# Patient Record
Sex: Male | Born: 1960 | Race: White | Hispanic: No | Marital: Married | Smoking: Never smoker
Health system: Southern US, Academic
[De-identification: ages and names within clinical notes are randomized; demographics above are authoritative.]

## PROBLEM LIST (undated history)

## (undated) DIAGNOSIS — I4891 Unspecified atrial fibrillation: Secondary | ICD-10-CM

## (undated) DIAGNOSIS — E119 Type 2 diabetes mellitus without complications: Secondary | ICD-10-CM

## (undated) DIAGNOSIS — E7800 Pure hypercholesterolemia, unspecified: Secondary | ICD-10-CM

## (undated) DIAGNOSIS — E559 Vitamin D deficiency, unspecified: Secondary | ICD-10-CM

## (undated) DIAGNOSIS — I10 Essential (primary) hypertension: Secondary | ICD-10-CM

## (undated) HISTORY — PX: HX CARDIAC ABLATION: 2100001323

---

## 2022-05-16 ENCOUNTER — Encounter (HOSPITAL_COMMUNITY): Payer: Self-pay | Admitting: Certified Registered"

## 2022-05-16 ENCOUNTER — Encounter (HOSPITAL_COMMUNITY)
Admission: RE | Disposition: A | Discharge status: Home / Self Care | Payer: Self-pay | Source: Home / Self Care | Attending: Podiatrist

## 2022-05-16 ENCOUNTER — Other Ambulatory Visit: Payer: Self-pay

## 2022-05-16 ENCOUNTER — Inpatient Hospital Stay
Admission: RE | Admit: 2022-05-16 | Discharge: 2022-05-16 | Disposition: A | Discharge status: Home / Self Care | Payer: BC Managed Care – PPO | Attending: Podiatrist | Admitting: Podiatrist

## 2022-05-16 ENCOUNTER — Encounter (HOSPITAL_COMMUNITY): Payer: Self-pay | Admitting: Podiatrist

## 2022-05-16 DIAGNOSIS — M19072 Primary osteoarthritis, left ankle and foot: Secondary | ICD-10-CM | POA: Insufficient documentation

## 2022-05-16 HISTORY — DX: Type 2 diabetes mellitus without complications (CMS HCC): E11.9

## 2022-05-16 HISTORY — DX: Essential (primary) hypertension: I10

## 2022-05-16 HISTORY — DX: Unspecified atrial fibrillation (CMS HCC): I48.91

## 2022-05-16 HISTORY — DX: Pure hypercholesterolemia, unspecified: E78.00

## 2022-05-16 HISTORY — DX: Vitamin D deficiency, unspecified: E55.9

## 2022-05-16 SURGERY — AMPUTATION TOE
Anesthesia: Local (Nurse-Monitored) | Site: Toe | Laterality: Left | Wound class: Dirty or Infected Wounds-Include old traumatic wounds

## 2022-05-16 MED ORDER — BUPIVACAINE-EPINEPHRINE 0.5 %-1:200,000 INJECTION SOLUTION
Freq: Once | INTRAMUSCULAR | Status: DC | PRN
Start: 2022-05-16 — End: 2022-05-16
  Administered 2022-05-16: 3 mL via INTRAMUSCULAR

## 2022-05-16 MED ORDER — LIDOCAINE-EPINEPHRINE (PF) 1 %-1:200,000 INJECTION SOLUTION
Freq: Once | INTRAMUSCULAR | Status: DC | PRN
Start: 2022-05-16 — End: 2022-05-16
  Administered 2022-05-16: 20 mL via INTRAMUSCULAR

## 2022-05-16 MED ORDER — LIDOCAINE HCL 10 MG/ML (1 %) INJECTION SOLUTION
INTRAMUSCULAR | Status: AC
Start: 2022-05-16 — End: 2022-05-16
  Filled 2022-05-16: qty 20

## 2022-05-16 MED ORDER — LIDOCAINE-EPINEPHRINE (PF) 1 %-1:200,000 INJECTION SOLUTION
INTRAMUSCULAR | Status: AC
Start: 2022-05-16 — End: 2022-05-16
  Filled 2022-05-16: qty 10

## 2022-05-16 MED ORDER — ROPIVACAINE (PF) 2 MG/ML (0.2 %) INJECTION SOLUTION
INTRAMUSCULAR | Status: AC
Start: 2022-05-16 — End: 2022-05-16
  Filled 2022-05-16: qty 10

## 2022-05-16 MED ORDER — LACTATED RINGERS INTRAVENOUS SOLUTION
INTRAVENOUS | Status: DC
Start: 2022-05-16 — End: 2022-05-16

## 2022-05-16 MED ORDER — LIDOCAINE HCL 10 MG/ML (1 %) INJECTION SOLUTION
Freq: Once | INTRAMUSCULAR | Status: DC | PRN
Start: 2022-05-16 — End: 2022-05-16
  Administered 2022-05-16: 5 mL via INTRAMUSCULAR

## 2022-05-16 MED ORDER — CEFAZOLIN 1 GRAM SOLUTION FOR INJECTION
INTRAMUSCULAR | Status: AC
Start: 2022-05-16 — End: 2022-05-16
  Filled 2022-05-16: qty 30

## 2022-05-16 MED ORDER — SODIUM CHLORIDE 0.9 % INTRAVENOUS PIGGYBACK
INJECTION | INTRAVENOUS | Status: AC
Start: 2022-05-16 — End: 2022-05-16
  Filled 2022-05-16: qty 150

## 2022-05-16 MED ORDER — BUPIVACAINE (PF) 0.25 % (2.5 MG/ML) INJECTION SOLUTION
INTRAMUSCULAR | Status: AC
Start: 2022-05-16 — End: 2022-05-16
  Filled 2022-05-16: qty 10

## 2022-05-16 SURGICAL SUPPLY — 55 items
BAG SUT DVN STRL LF (SUTURE/WOUND CLOSURE) IMPLANT
BANDAGE 3.6YDX3.4IN 6 PLY HYPOALL LOFT LIGHT STRCH COTTON GAUZE STRL LF  DISP (WOUND CARE SUPPLY) ×2 IMPLANT
BANDAGE COFLX 5YDX4IN NONST CHSV SLF ADH FOAM COMPRESS TAN LF (WOUND CARE SUPPLY) ×1 IMPLANT
BLADE 10 2 END CBNSTL SURG STRL DISP (SURGICAL CUTTING SUPPLIES) ×2 IMPLANT
BLADE 15 2 END CBNSTL SURG STRL DISP (SURGICAL CUTTING SUPPLIES) ×1 IMPLANT
CLEANER ESURG TIP 2X2IN TIP POLISHR CAUT STRL LF (SURGICAL CUTTING SUPPLIES) IMPLANT
CONTAINR CLICKSEAL 4OZ TRANSLUC SCREW CAP STRL BLU SPECI PNEUM TUBE SYS (SPECIMEN COLLECTION SUPPLIES) ×1 IMPLANT
CONV USE 123874 - SYRINGE AMSURE MDCHC 60CC LF  STRL TIP PRTC SM TUBE ADPR IRRG DISC BULB POLYPROP (MED SURG SUPPLIES) IMPLANT
CONV USE ITEM 338662 - PACK SURG ASCP STRL DISP ~~LOC~~ BPT MED CNTR LF (CUSTOM TRAYS & PACK) ×1 IMPLANT
COUNTER 20 CNT BLOCK ADH NEEDLE STRL LF  RD SHARP FOAM 15.75X11.5X14IN DISP (MED SURG SUPPLIES) ×1 IMPLANT
COVER 53X24IN MAYOSTAND PRXM STRL DISP EQP SMS LF (DRAPE/PACKS/SHEETS/OR TOWEL) IMPLANT
COVER TBL 90X50IN STD SMS REINF FNFLD STRL LF  DISP (DRAPE/PACKS/SHEETS/OR TOWEL) IMPLANT
CUFF TOURNIQUET PURP 34X4IN COLOR CUF CYL 2 PORT 1 BLADDER QC 40IN STRL LF  DISP (MED SURG SUPPLIES) IMPLANT
CUFF TOURNIQUET RD 18X4IN COLOR CUF CYL 2 PORT BLADDER QC LOW PROF STRL LF  DISP (MED SURG SUPPLIES) IMPLANT
CUFF TOURNIQUET RYL BLU 30X4IN COLOR CUF CYL 2 PORT 1 BLADDER QC LOW PROF 40IN STRL LF  DISP (MED SURG SUPPLIES) IMPLANT
DETERGENT INSTR 22OZ TRNSPT GEL RINSE FREE NEUT PH PREKLENZ CLR PLSNT LF (MISCELLANEOUS PT CARE ITEMS) ×1 IMPLANT
DISCONTINUED USE ITEM 300806 - SUTURE 3-0 ETHILON 30IN BLK MONOF NONAB (SUTURE/WOUND CLOSURE) ×1 IMPLANT
DRAPE FNFLD ABS REINF 77X53IN 43528 PRXM LF  STRL DISP SURG SMS 44X23IN (DRAPE/PACKS/SHEETS/OR TOWEL) IMPLANT
DRESS PETRO 9X5IN CURAD XR COTTON NONADH OCL IMPREGNATE LF  STRL WHT (WOUND CARE SUPPLY) ×1 IMPLANT
ELECTRODE ESURG BLADE PNCL 15FT VLAB EDGE TELESCP SMOKE EVAC (SURGICAL CUTTING SUPPLIES) ×1 IMPLANT
GLOVE SURG 6 LF  PF BEAD CUF STRL CRM 11.3IN PROTEXIS PLISPRN THK9.1 MIL (GLOVES AND ACCESSORIES) IMPLANT
GLOVE SURG 6 LF  PF SMOOTH BEAD CUF INTLK STRL BLU 11.3IN PROTEXIS NEU-THERA PLISPRN THK7.9 MIL (GLOVES AND ACCESSORIES) IMPLANT
GLOVE SURG 6.5 LF  PF BEAD CUF STRL CRM 11.3IN PROTEXIS PI PLISPRN THK9.1 MIL (GLOVES AND ACCESSORIES) IMPLANT
GLOVE SURG 6.5 LF  PF SMOOTH BEAD CUF INTLK STRL BLU 11.3IN PROTEXIS NEU-THERA PLISPRN THK7.9 MIL (GLOVES AND ACCESSORIES) IMPLANT
GLOVE SURG 6.5 LTX PF SMOOTH BEAD CUF STRL YW 11.5IN PROTEXIS NEU-THERA DDRGL THK8.7 MIL (GLOVES AND ACCESSORIES) IMPLANT
GLOVE SURG 7 LF  PF BEAD CUF STRL CRM 11.8IN PROTEXIS PI PLISPRN THK9.1 MIL (GLOVES AND ACCESSORIES) ×1 IMPLANT
GLOVE SURG 7 LF  PF SMOOTH BEAD CUF INTLK STRL BLU 11.8IN PROTEXIS NEU-THERA PLISPRN THK7.9 MIL (GLOVES AND ACCESSORIES) IMPLANT
GLOVE SURG 7 LTX PF SMOOTH BEAD CUF STRL YW 12IN PROTEXIS NEU-THERA DDRGL THK8.7 MIL (GLOVES AND ACCESSORIES) IMPLANT
GLOVE SURG 7.5 LF  PF BEAD CUF STRL CRM 11.8IN PROTEXIS PI PLISPRN THK9.1 MIL (GLOVES AND ACCESSORIES) IMPLANT
GLOVE SURG 7.5 LF  PF SMOOTH BEAD CUF INTLK STRL BLU 11.8IN PROTEXIS NEU-THERA PLISPRN THK7.9 MIL (GLOVES AND ACCESSORIES) IMPLANT
GLOVE SURG 7.5 LTX PF SMOOTH BEAD CUF STRL YW 12IN PROTEXIS (GLOVES AND ACCESSORIES) IMPLANT
GLOVE SURG 8 LF  PF BEAD CUF STRL CRM 11.8IN PROTEXIS PI PLISPRN THK9.1 MIL (GLOVES AND ACCESSORIES) IMPLANT
GLOVE SURG 8 LF  PF SMOOTH BEAD CUF INTLK STRL BLU 11.8IN PROTEXIS NEU-THERA PLISPRN THK7.9 MIL (GLOVES AND ACCESSORIES) IMPLANT
GLOVE SURG 8 LTX PF SMOOTH BEAD CUF STRL YW 12IN PROTEXIS NEU-THERA DDRGL THK8.7 MIL (GLOVES AND ACCESSORIES) ×1 IMPLANT
GLOVE SURG 8.5 LF  PF BEAD CUF STRL CRM 11.8IN PROTEXIS PI PLISPRN THK9.1 MIL (GLOVES AND ACCESSORIES) IMPLANT
GOWN SURG XL STD LGTH L3 NONREINFORCE HKLP CLSR TWL STRL LF  DISP BLU SPECTRUM SMS (DRAPE/PACKS/SHEETS/OR TOWEL)
GOWN SURG XL STD LGTH L3 NONREINFORCE HKLP CLSR TWL STRL LF DISP BLU SPECTRUM SMS (DRAPE/PACKS/SHEETS/OR TOWEL) IMPLANT
HEMOSTAT ABS 8X4IN FLXB SHR WV_SRGCL STRL DISP (WOUND CARE SUPPLY) IMPLANT
LABEL MED CORRECT MED LABELING SYS 4 FLG 2 SHEET 24 PRPRNT STRL (MED SURG SUPPLIES) ×1 IMPLANT
MAT INSTR TRY 44X36IN WTPRF BACKSHEET TPNX BLU (MISCELLANEOUS PT CARE ITEMS) ×1 IMPLANT
NEEDLE HYPO  18GA 1.5IN REG WL BD PRCSNGL POLYPROP REG BVL LL HUB CLR CD DEHP-FR STRL LF  DISP (MED SURG SUPPLIES) ×1 IMPLANT
NEEDLE HYPO  25GA 1.25IN STD MONOJECT AL SS REG BVL LL HUB UL SHRP ANTICORE RD STRL LF  DISP (MED SURG SUPPLIES) ×3 IMPLANT
PACK SURG ASCP STRL DISP ~~LOC~~ BPT MED CNTR LF (CUSTOM TRAYS & PACK) ×1
PAD ABDOMINAL 8X7.5IN LF  STRL (WOUND CARE SUPPLY) ×2 IMPLANT
SOL IRRG 0.9% NACL 1000ML PLASTIC PR BTL ISTNC N-PYRG STRL LF (MEDICATIONS/SOLUTIONS) IMPLANT
SPONGE GAUZE 4X4IN MDCHC COTTON 12 PLY TY 7 LF  STRL DISP (WOUND CARE SUPPLY) ×2 IMPLANT
SPONGE SURG 4X4IN 16 PLY XRY DETECT COTTON STRL LF  DISP (WOUND CARE SUPPLY) IMPLANT
STKNT ORTHO 48X4IN COTTON PLSTR 1 PLY PCUT SEWN END LF  TUB STRL OFF WHT (ORTHOPEDICS (NOT IMPLANTS)) ×1 IMPLANT
STRIP 5YDX.5IN IFRM COTTON GAUZE WOUND CURAD WOVEN STRL LF (WOUND CARE SUPPLY) IMPLANT
SUTURE 3-0 ETHILON 30IN BLK MONOF NONAB (SUTURE/WOUND CLOSURE) ×1
SUTURE 4-0 FS2 ETHILON 18IN BLK MONOF NONAB (SUTURE/WOUND CLOSURE) ×1 IMPLANT
SYRINGE AMSURE MDCHC 60CC LF  STRL TIP PRTC SM TUBE ADPR IRRG DISC BULB POLYPROP (MED SURG SUPPLIES)
SYRINGE LL 10ML LF  STRL GRAD N-PYRG DEHP-FR PVC FREE MED DISP (MED SURG SUPPLIES) ×3 IMPLANT
TOWEL 24X16IN COTTON BLU DISP SURG STRL LF (DRAPE/PACKS/SHEETS/OR TOWEL) ×2 IMPLANT
TUBING SUCT CLR 6FT .25IN ARGYLE PVC NCDTV STR MALE FEMALE MLD CONN STRL LF (MED SURG SUPPLIES) ×1 IMPLANT

## 2022-05-16 NOTE — OR Surgeon (Signed)
PATIENT NAME: Leonard Smith, Leonard Smith   HOSPITAL NUMBER:  [id number]  DATE OF SERVICE: 05/16/2022  DATE OF BIRTH:  1960/07/12    OPERATIVE REPORT    PREOPERATIVE DIAGNOSIS:  Osteoarthritis, left hallux.    PREOPERATIVE DIAGNOSIS:  Osteoarthritis, left hallux.    NAME OF PROCEDURE:  Amputation at the metatarsophalangeal joints (MPJ), left hallux.    SURGEON:  Clydene Laming, DPM.    ASSISTANT:  None.    ANESTHESIA:  Local.    ESTIMATED BLOOD LOSS:  Less than 3 mL.    DESCRIPTION OF PROCEDURE:  The patient was brought into the operating room and placed on the operating table in a supine position.  The left foot was blocked using a male block technique utilizing a 7:3 mixture of 0.25% plain Marcaine and 1% lidocaine with epinephrine.  Next the left foot was scrubbed, prepped, and draped in the usual aseptic manner.  Attention was then directed to the dorsal aspect of the left hallux where a [place] incision was made.  The incision was deepened to the subcutaneous tissue with sharp and blunt dissection.  Care was taken to identify and retract all the vital and neurovascular structures.  All bleeders were cauterized and ligated as necessary. The digit was disarticulated at the MPJ.  It was removed and sent to pathology for further review.  There were no signs of infection of the metatarsal head or abscess.  The area was flushed with copious amounts of sterile normal saline.  Deep wound cultures were taken.  Next the incision was reapproximated and coapted using 3-0 and 4-0 nylon.  A postoperative dressing consisting of 4 x 4's, Kerlix, ABDs, Coban, and Xeroform was applied.  The patient tolerated the procedure and anesthesia well.  He was transferred with vital signs stable and vascular status intact.        Clydene Laming, DPM                DD:  05/16/2022 21:00:27  DT:  05/16/2022 23:07:53 MH  D#:  [id number]

## 2022-05-16 NOTE — Discharge Instructions (Signed)
Follow up with Dr Everardo All.     Do not remove dressing.  You can add additional dressings to the existing dressing if needed for drainage.    Weight bearing on operative foot is NON-WEIGHT BEAR.     Call for problems, questions, concerns.    Resume home meds.    Elevate foot on 2-3 pillows.    Wear cast shoe with ambulation.

## 2022-05-17 NOTE — Nurses Notes (Signed)
Addendium to chart. Bilateral Pedal pulses palpated. Feet warm to touch. Great toe left foot red. Dressing noted.

## 2022-05-21 LAB — ANAEROBIC CULTURE: ANAEROBIC CULTURE: NO GROWTH

## 2022-05-23 LAB — TISSUE CULTURE (AEROBIC CULT & GRAM STAIN): GRAM STAIN: NONE SEEN

## 2022-05-25 DIAGNOSIS — M869 Osteomyelitis, unspecified: Secondary | ICD-10-CM

## 2022-05-25 LAB — SURGICAL PATHOLOGY SPECIMEN

## 2022-07-26 IMAGING — MR MRI LT FOOT W AND WO CONTRAST
4 of 9 series · 19 of 40 positions shown · IV contrast (gadavist)
Comparison: None available.

﻿EXAM:  [id number]   MRI LT FOOT W AND WO CONTRAST
INDICATION: Recent surgery done to remove big toe due to osteomyelitis, check for infection around toe area.
TECHNIQUE: Multiplanar, multisequence MRI was performed both before and after the intravenous administration of 10 mL Gadavist.

[Series 9: T1 · sagittal · left · 4.0mm · 0.59mm/px · 5 of 22 slices shown (1 of 2)]
[im 1/22]
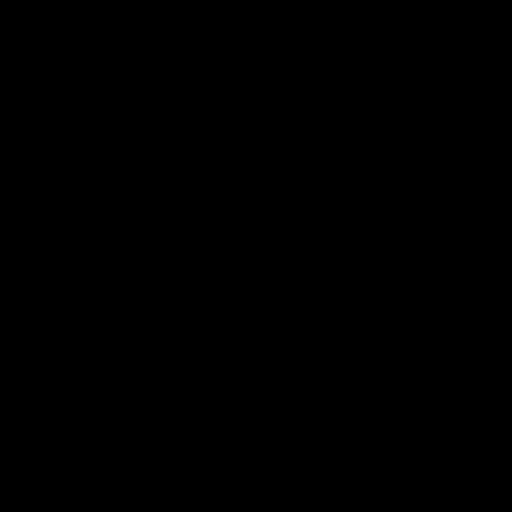
[im 6/22]
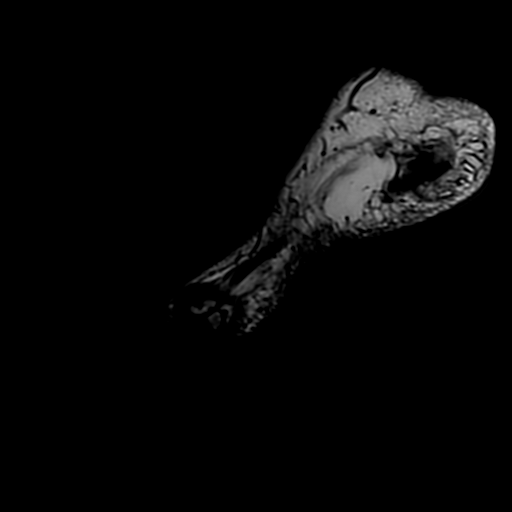
[im 11/22]
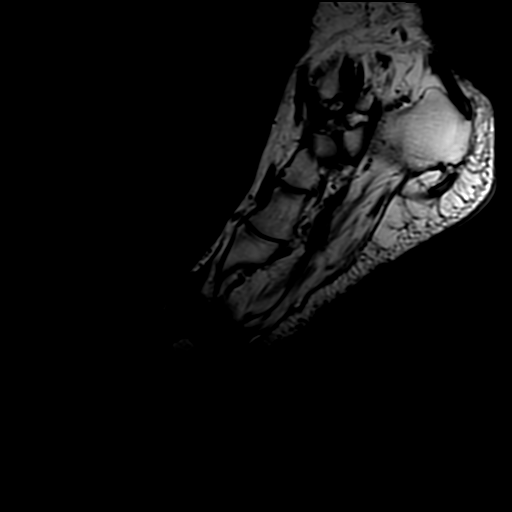
[im 16/22]
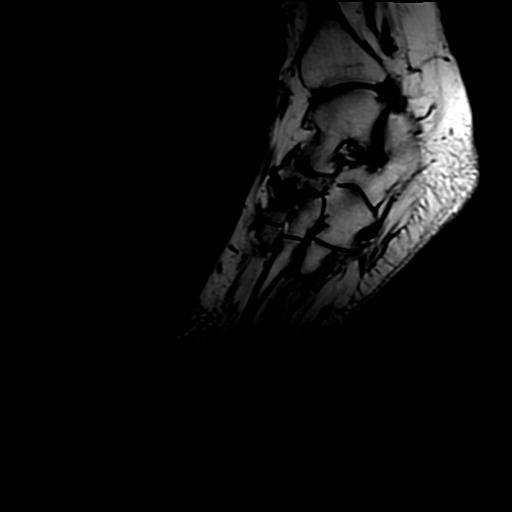
[im 22/22]
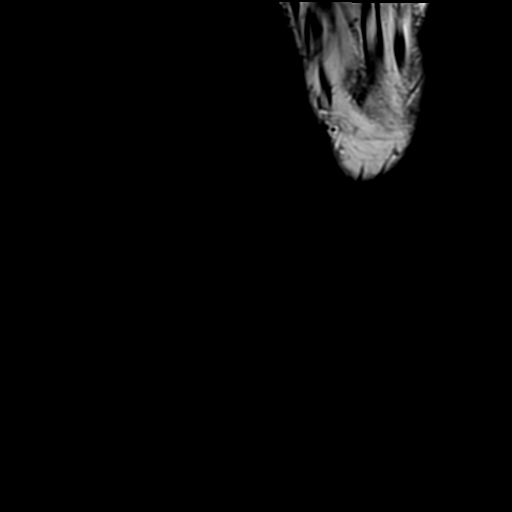

[Series 10: T2 fat-sat · sagittal · left · 4.0mm · 0.59mm/px · 5 of 22 slices shown (1 of 2)]
[im 1/22]
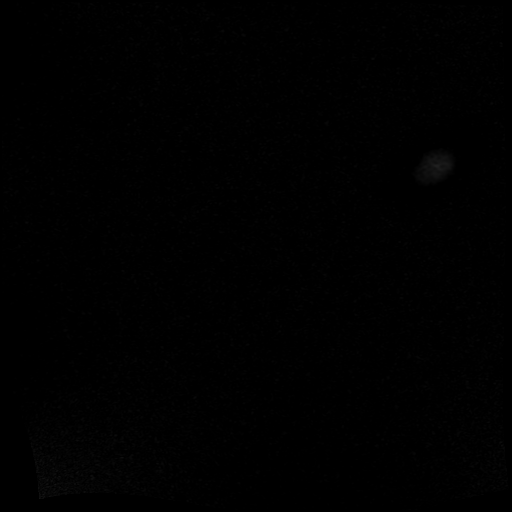
[im 6/22]
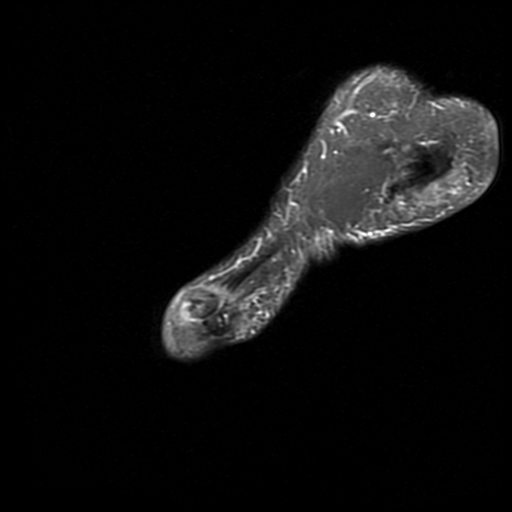
[im 11/22]
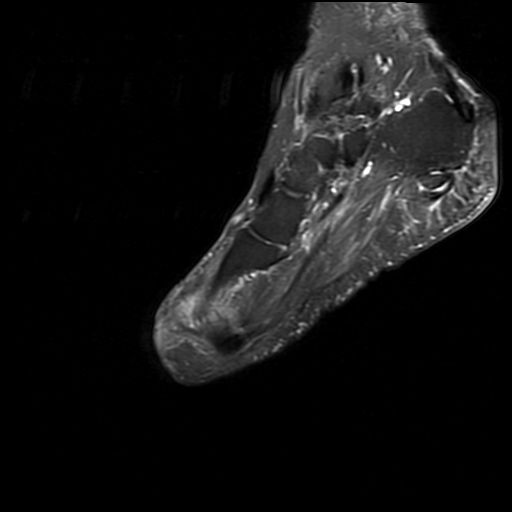
[im 16/22]
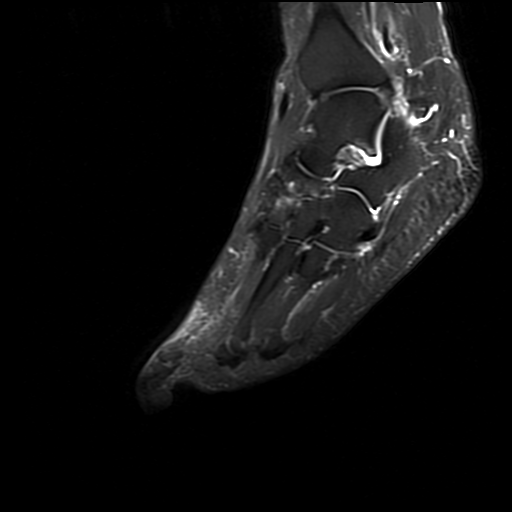
[im 22/22]
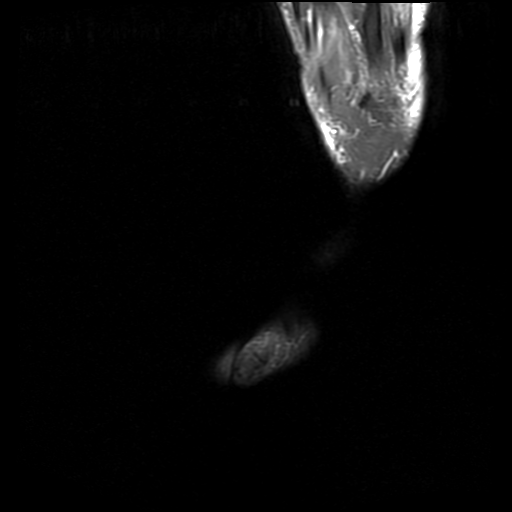

[Series 11: T1 · coronal · left · 6.5mm · 0.37mm/px · 5 of 25 slices shown (2 of 2)]
[im 1/25]
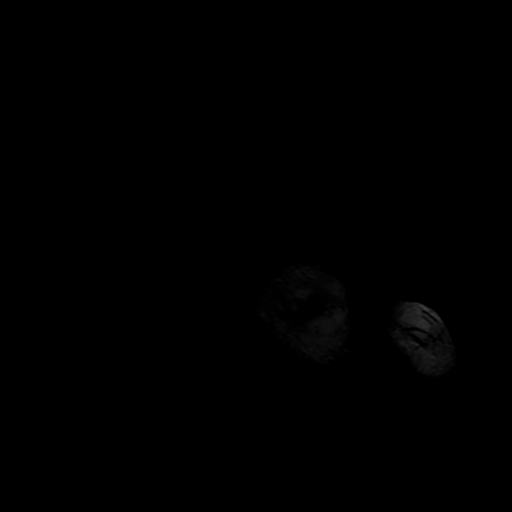
[im 7/25]
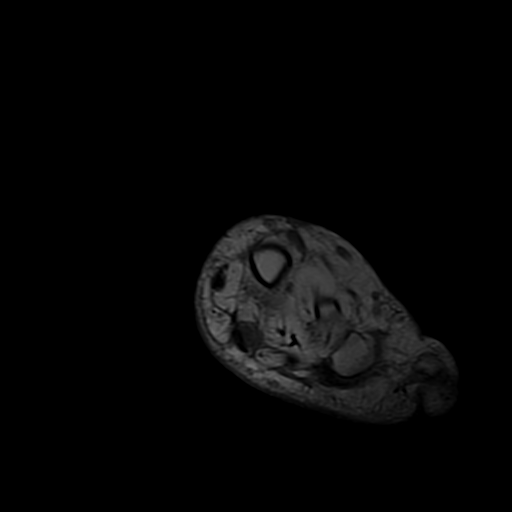
[im 13/25]
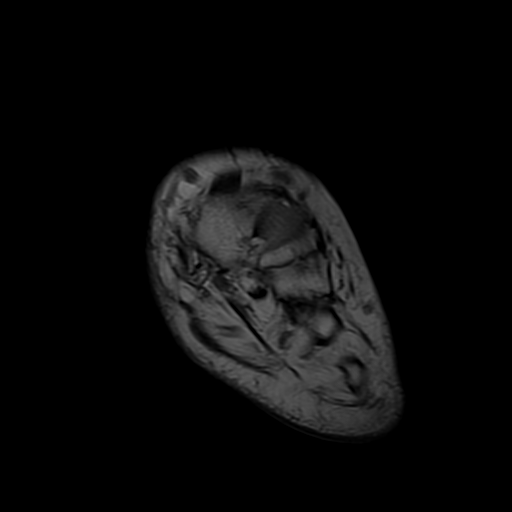
[im 19/25]
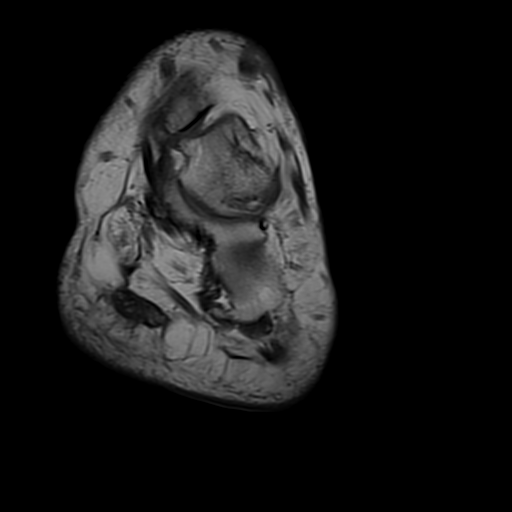
[im 25/25]
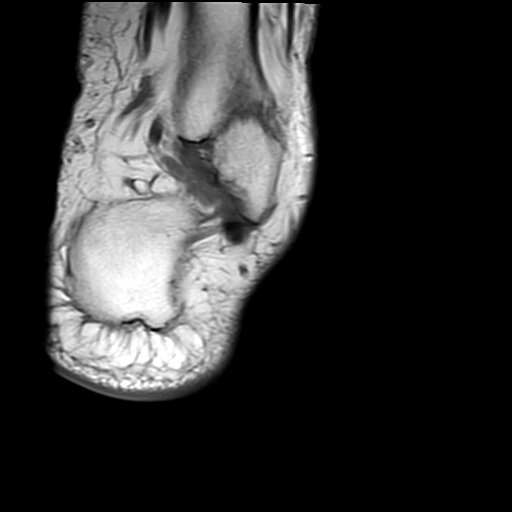

[Series 14: T2 fat-sat · axial · left · 4.0mm · 0.57mm/px · z∈[-105,-24]mm · 4 of 20 slices shown (2 of 2)]
[im 1/20]
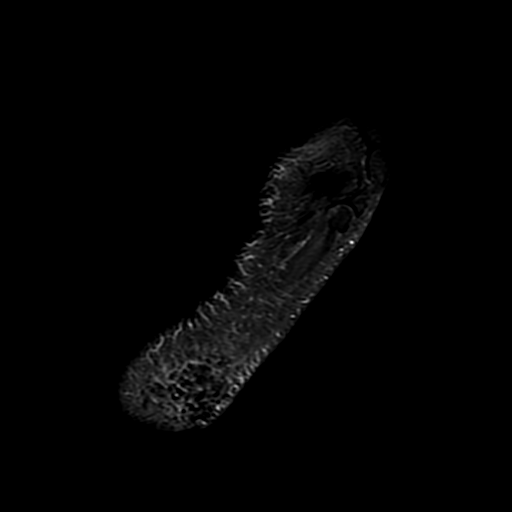
[im 7/20]
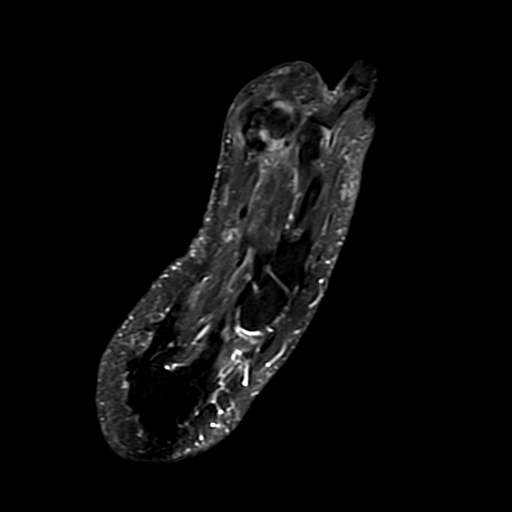
[im 13/20]
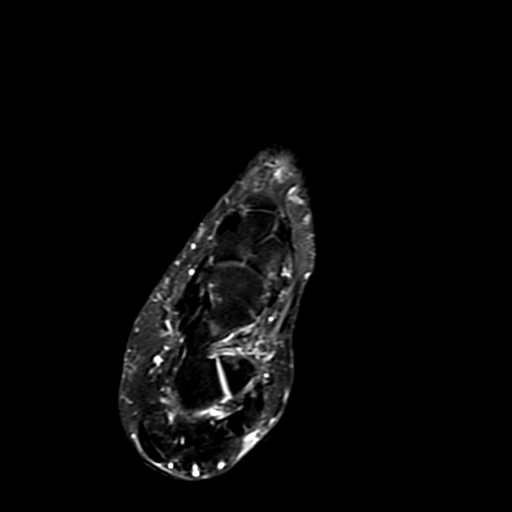
[im 20/20]
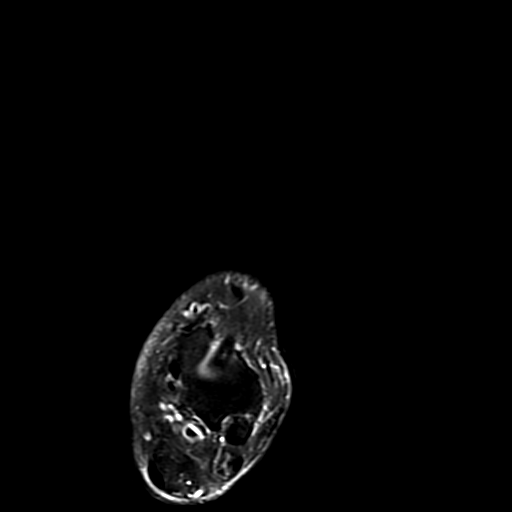

[19 of 40 positions shown; findings below may reference images not displayed]

FINDINGS: This examination is limited because the patient’s large size 14 foot did not fit easily into the coil.  There is also motion artifact.

There is amputation of the great toe.  There is a moderate amount of edema in the first metatarsal head with slight enhancement suspicious for osteomyelitis.  There is a small amount of surrounding soft tissue edema, fluid, and enhancement.  

Degenerative changes are noted, especially in the midfoot.  No plain films are available at this time for comparison.
IMPRESSION: 1. Limited examination.

2. Suspected osteomyelitis in the first metatarsal head.
